# Patient Record
Sex: Female | Born: 1960 | Race: White | Hispanic: No | Marital: Married | Smoking: Former smoker
Health system: Southern US, Community
[De-identification: ages and names within clinical notes are randomized; demographics above are authoritative.]

## PROBLEM LIST (undated history)

## (undated) DIAGNOSIS — E039 Hypothyroidism, unspecified: Secondary | ICD-10-CM

## (undated) DIAGNOSIS — G629 Polyneuropathy, unspecified: Secondary | ICD-10-CM

## (undated) DIAGNOSIS — E782 Mixed hyperlipidemia: Secondary | ICD-10-CM

## (undated) HISTORY — PX: BREAST SURGERY: SHX581

## (undated) HISTORY — DX: Mixed hyperlipidemia: E78.2

## (undated) HISTORY — PX: BREAST REDUCTION SURGERY: SHX8

## (undated) HISTORY — DX: Polyneuropathy, unspecified: G62.9

---

## 1997-08-27 ENCOUNTER — Ambulatory Visit (HOSPITAL_COMMUNITY): Admission: RE | Admit: 1997-08-27 | Discharge: 1997-08-27 | Payer: Self-pay | Admitting: Vascular Surgery

## 1997-08-27 ENCOUNTER — Encounter: Payer: Self-pay | Admitting: Obstetrics and Gynecology

## 1997-09-09 ENCOUNTER — Other Ambulatory Visit: Admission: RE | Admit: 1997-09-09 | Discharge: 1997-09-09 | Payer: Self-pay | Admitting: Obstetrics and Gynecology

## 1997-10-09 ENCOUNTER — Ambulatory Visit (HOSPITAL_COMMUNITY): Admission: RE | Admit: 1997-10-09 | Discharge: 1997-10-09 | Payer: Self-pay | Admitting: Obstetrics and Gynecology

## 1997-10-09 ENCOUNTER — Encounter: Payer: Self-pay | Admitting: Obstetrics and Gynecology

## 1999-11-04 ENCOUNTER — Other Ambulatory Visit: Admission: RE | Admit: 1999-11-04 | Discharge: 1999-11-04 | Payer: Self-pay | Admitting: Obstetrics and Gynecology

## 2000-11-07 ENCOUNTER — Other Ambulatory Visit: Admission: RE | Admit: 2000-11-07 | Discharge: 2000-11-07 | Payer: Self-pay | Admitting: Obstetrics and Gynecology

## 2001-12-17 ENCOUNTER — Other Ambulatory Visit: Admission: RE | Admit: 2001-12-17 | Discharge: 2001-12-17 | Payer: Self-pay | Admitting: Obstetrics and Gynecology

## 2002-12-04 ENCOUNTER — Ambulatory Visit (HOSPITAL_COMMUNITY): Admission: RE | Admit: 2002-12-04 | Discharge: 2002-12-04 | Payer: Self-pay | Admitting: Obstetrics and Gynecology

## 2010-01-23 ENCOUNTER — Encounter: Payer: Self-pay | Admitting: Obstetrics and Gynecology

## 2015-12-15 ENCOUNTER — Encounter (INDEPENDENT_AMBULATORY_CARE_PROVIDER_SITE_OTHER): Payer: Self-pay | Admitting: *Deleted

## 2016-06-09 ENCOUNTER — Other Ambulatory Visit (INDEPENDENT_AMBULATORY_CARE_PROVIDER_SITE_OTHER): Payer: Self-pay | Admitting: *Deleted

## 2016-06-09 DIAGNOSIS — Z800 Family history of malignant neoplasm of digestive organs: Secondary | ICD-10-CM | POA: Insufficient documentation

## 2016-06-09 DIAGNOSIS — Z1211 Encounter for screening for malignant neoplasm of colon: Secondary | ICD-10-CM

## 2016-09-08 ENCOUNTER — Encounter (INDEPENDENT_AMBULATORY_CARE_PROVIDER_SITE_OTHER): Payer: Self-pay | Admitting: *Deleted

## 2016-09-08 ENCOUNTER — Telehealth (INDEPENDENT_AMBULATORY_CARE_PROVIDER_SITE_OTHER): Payer: Self-pay | Admitting: *Deleted

## 2016-09-08 NOTE — Telephone Encounter (Signed)
Patient needs trilyte 

## 2016-09-09 MED ORDER — PEG 3350-KCL-NA BICARB-NACL 420 G PO SOLR: 4000.0000 mL | Freq: Once | ORAL | 0 refills | Status: AC

## 2016-09-20 ENCOUNTER — Telehealth (INDEPENDENT_AMBULATORY_CARE_PROVIDER_SITE_OTHER): Payer: Self-pay | Admitting: *Deleted

## 2016-09-20 NOTE — Telephone Encounter (Signed)
Referring MD/PCP: howard   Procedure: tcs  Reason/Indication:  Screening,  fam hx colon ca  Has patient had this procedure before?  no  If so, when, by whom and where?    Is there a family history of colon cancer?  Yes, maternal grandmother  Who?  What age when diagnosed?    Is patient diabetic?   no      Does patient have prosthetic heart valve or mechanical valve?  no  Do you have a pacemaker?  no  Has patient ever had endocarditis? no  Has patient had joint replacement within last 12 months?  no  Does patient tend to be constipated or take laxatives? no  Does patient have a history of alcohol/drug use?  no  Is patient on Coumadin, Plavix and/or Aspirin? no  Medications: vitamins  Allergies: nkda  Medication Adjustment per Dr Karilyn Cota:   Procedure date & time: 10/12/16 at 1030

## 2016-09-21 NOTE — Telephone Encounter (Signed)
agree

## 2016-10-12 ENCOUNTER — Encounter (HOSPITAL_COMMUNITY)
Admission: RE | Disposition: A | Discharge status: Home / Self Care | Payer: Self-pay | Source: Ambulatory Visit | Attending: Internal Medicine

## 2016-10-12 ENCOUNTER — Ambulatory Visit (HOSPITAL_COMMUNITY)
Admission: RE | Admit: 2016-10-12 | Discharge: 2016-10-12 | Disposition: A | Discharge status: Home / Self Care | Payer: BLUE CROSS/BLUE SHIELD | Source: Ambulatory Visit | Attending: Internal Medicine | Admitting: Internal Medicine

## 2016-10-12 ENCOUNTER — Encounter (HOSPITAL_COMMUNITY): Payer: Self-pay | Admitting: *Deleted

## 2016-10-12 DIAGNOSIS — Z87891 Personal history of nicotine dependence: Secondary | ICD-10-CM | POA: Diagnosis not present

## 2016-10-12 DIAGNOSIS — Z1211 Encounter for screening for malignant neoplasm of colon: Secondary | ICD-10-CM | POA: Diagnosis not present

## 2016-10-12 DIAGNOSIS — K648 Other hemorrhoids: Secondary | ICD-10-CM | POA: Diagnosis not present

## 2016-10-12 DIAGNOSIS — E039 Hypothyroidism, unspecified: Secondary | ICD-10-CM | POA: Insufficient documentation

## 2016-10-12 DIAGNOSIS — K5730 Diverticulosis of large intestine without perforation or abscess without bleeding: Secondary | ICD-10-CM | POA: Insufficient documentation

## 2016-10-12 DIAGNOSIS — Z800 Family history of malignant neoplasm of digestive organs: Secondary | ICD-10-CM

## 2016-10-12 DIAGNOSIS — Z79899 Other long term (current) drug therapy: Secondary | ICD-10-CM | POA: Insufficient documentation

## 2016-10-12 HISTORY — DX: Hypothyroidism, unspecified: E03.9

## 2016-10-12 HISTORY — PX: COLONOSCOPY: SHX5424

## 2016-10-12 SURGERY — COLONOSCOPY
Anesthesia: Moderate Sedation

## 2016-10-12 MED ORDER — MEPERIDINE HCL 50 MG/ML IJ SOLN
INTRAMUSCULAR | Status: AC
  Filled 2016-10-12: qty 1

## 2016-10-12 MED ORDER — MIDAZOLAM HCL 5 MG/5ML IJ SOLN
INTRAMUSCULAR | Status: DC | PRN
  Administered 2016-10-12: 2 mg via INTRAVENOUS
  Administered 2016-10-12 (×3): 1 mg via INTRAVENOUS
  Administered 2016-10-12 (×2): 2 mg via INTRAVENOUS

## 2016-10-12 MED ORDER — SODIUM CHLORIDE 0.9 % IV SOLN
INTRAVENOUS | Status: DC
  Administered 2016-10-12: 1000 mL via INTRAVENOUS

## 2016-10-12 MED ORDER — BENEFIBER DRINK MIX PO PACK: 4.0000 g | PACK | Freq: Every day | ORAL | Status: AC

## 2016-10-12 MED ORDER — MIDAZOLAM HCL 5 MG/5ML IJ SOLN
INTRAMUSCULAR | Status: AC
  Filled 2016-10-12: qty 10

## 2016-10-12 MED ORDER — MEPERIDINE HCL 50 MG/ML IJ SOLN
INTRAMUSCULAR | Status: DC | PRN
  Administered 2016-10-12 (×2): 25 mg

## 2016-10-12 MED ORDER — DICYCLOMINE HCL 10 MG PO CAPS: 10.0000 mg | ORAL_CAPSULE | Freq: Two times a day (BID) | ORAL | 3 refills | Status: AC | PRN

## 2016-10-12 NOTE — Discharge Instructions (Signed)
Resume usual medications as before. High fiber diet. Benefiber or equivalent to 4 g by mouth daily at bedtime. Dicyclomine 10 mg by mouth twice daily as needed. No driving for 24 hours. Next screening colonoscopy in 10 years.

## 2016-10-12 NOTE — H&P (Signed)
Linda Aguilar is an 56 y.o. female.   Chief Complaint: Patient is here for colonoscopy. HPI: Patient is 56 year old Caucasian fe abdominal pain cha She has hemorrhoids and may notice blood every now and then with a bowel movement. Family history significant for CRC paternal grandfather was in his  Past Medical History:  Diagnosis Date  . Hypothyroidism     Past Surgical History:  Procedure Laterality Date  . BREAST SURGERY     reduction    Family History  Problem Relation Age of Onset  . Alzheimer's disease Mother   . Bladder Cancer Father   . Parkinson's disease Brother    Social History:  reports that she has quit smoking. She has never used smokeless tobacco. She reports that she drinks alcohol. She reports that she does not use drugs.  Allergies: No Known Allergies  Medications Prior to Admission  Medication Sig Dispense Refill  . ARMOUR THYROID 30 MG tablet Take 15-30 mg by mouth See admin instructions. Take 30 mg by mouth in the morning and take 15 mg by mouth at night  2  . Digestive Enzymes (DIGESTIVE ENZYME PO) Take 1 tablet by mouth 3 (three) times daily.    Marland Kitchen estradiol (ESTRACE) 0.5 MG tablet Take 0.5 mg by mouth daily.    Marland Kitchen MAGNESIUM PO Take 400 mg by mouth daily.     . Melatonin 3 MG CAPS Take 3 mg by mouth at bedtime.    . nicotine polacrilex (NICORETTE) 2 MG gum Take 2 mg by mouth as needed for smoking cessation.    . NON FORMULARY Inject 0.2 mLs into the muscle every Tuesday. Testosterone /ml injection    . Omega-3 Fatty Acids (OMEGA 3 PO) Take 2 capsules by mouth 2 (two) times daily.    . Probiotic Product (PROBIOTIC PO) Take 1 capsule by mouth 3 (three) times daily.    . progesterone (PROMETRIUM) 100 MG capsule Take 100 mg by mouth daily.  2  . vitamin B-12 (CYANOCOBALAMIN) 1000 MCG tablet Take 1,000 mcg by mouth daily.    Marland Kitchen VITAMIN D-VITAMIN K PO Take 1 tablet by mouth daily.    Marland Kitchen zinc gluconate 50 MG tablet Take 50 mg by mouth daily.      No results  found for this or any previous visit (from the past 48 hour(s)). No results found.  ROS  Blood pressure 139/83, pulse 63, temperature 97.6 F (36.4 C), temperature source Oral, resp. rate 11, height  (1.575 m), weight 130 lb (59 kg), last menstrual period 10/03/2016, SpO2 100 %. Physical Exam  Constitutional: She appears well-developed and well-nourished.  HENT:  Mouth/Throat: Oropharynx is clear and moist.  Eyes: Conjunctivae are normal. No scleral icterus.  Neck: No thyromegaly present.  Cardiovascular: Normal rate, regular rhythm and normal heart sounds.   No murmur heard. Respiratory: Effort normal and breath sounds normal.  GI: Soft. She exhibits no distension and no mass. There is no tenderness.  Musculoskeletal: She exhibits no edema.  Lymphadenopathy:    She has no cervical adenopathy.  Neurological: She is alert.  Skin: Skin is warm and dry.     Assessment/Plan Average risk screening colonoscopy.  Lionel December, MD 10/12/2016, 10:10 AM

## 2016-10-12 NOTE — Op Note (Signed)
[facility] Patient Name: Linda Aguilar Procedure Date: 10/12/2016 9:52 AM MRN: [id number] Date of Birth: 1960/02/07 Attending MD: Lionel December , MD CSN: [id number] Age: 56 Admit Type: Outpatient Procedure:                Colonoscopy Indications:              Screening for colorectal malignant neoplasm Providers:                Lionel December, MD, Jannett Celestine, RN, Edrick Kins, RN Referring MD:             Selinda Flavin, MD Medicines:                Meperidine 50 mg IV, Midazolam 9 mg IV Complications:            No immediate complications. Estimated Blood Loss:     Estimated blood loss: none. Procedure:                Pre-Anesthesia Assessment:                           - Prior to the procedure, a History and Physical                            was performed, and patient medications and                            allergies were reviewed. The patient's tolerance of                            previous anesthesia was also reviewed. The risks                            and benefits of the procedure and the sedation                            options and risks were discussed with the patient.                            All questions were answered, and informed consent                            was obtained. Prior Anticoagulants: The patient has                            taken no previous anticoagulant or antiplatelet                            agents. ASA Grade Assessment: II - A patient with                            mild systemic disease. After reviewing the risks                            and benefits, the patient was deemed in  satisfactory condition to undergo the procedure.                           After obtaining informed consent, the colonoscope                            was passed under direct vision. Throughout the                            procedure, the patient's blood pressure, pulse, and                            oxygen saturations  were monitored continuously. The                            EC-[id number] ([id number]) scope was introduced through                            the anus and advanced to the the cecum, identified                            by appendiceal orifice and ileocecal valve. The                            [id number] ([id number]) scope was introduced through                            the and advanced to the. The colonoscopy was                            technically difficult and complex due to restricted                            mobility of the colon. Successful completion of the                            procedure was aided by changing the patient to a                            supine position, using manual pressure and                            withdrawing the scope and replacing with the                            'babyscope'. The patient tolerated the procedure                            well. The quality of the bowel preparation was                            excellent. The ileocecal valve, appendiceal  orifice, and rectum were photographed. Scope In: 10:21:38 AM Scope Out: 10:52:19 AM Scope Withdrawal Time: 0 hours 7 minutes 1 second  Total Procedure Duration: 0 hours 30 minutes 41 seconds  Findings:      The perianal and digital rectal examinations were normal.      A few medium-mouthed diverticula were found in the descending colon,       transverse colon and ascending colon.      Scattered medium-mouthed diverticula were found in the sigmoid colon.      External and internal hemorrhoids were found during retroflexion. The       hemorrhoids were small. Impression:               - Diverticulosis in the descending colon, in the                            transverse colon and in the ascending colon.                           - Diverticulosis in the sigmoid colon.                           - External and internal hemorrhoids.                           - No specimens  collected. Moderate Sedation:      Moderate (conscious) sedation was administered by the endoscopy nurse       and supervised by the endoscopist. The following parameters were       monitored: oxygen saturation, heart rate, blood pressure, CO2       capnography and response to care. Total physician intraservice time was       41 minutes. Recommendation:           - Patient has a contact number available for                            emergencies. The signs and symptoms of potential                            delayed complications were discussed with the                            patient. Return to normal activities tomorrow.                            Written discharge instructions were provided to the                            patient.                           - High fiber diet today.                           - Continue present medications.                           - Benefiber  4 g by mouth daily at bedtime.                           - Dicyclomine  by mouth tw a day when necessary.                           - Repeat colonoscopy in 10 years for screening                            purposes. Procedure Code(s):        --- Professional ---                           [phone], Colonoscopy, flexible; diagnostic, including                            collection of specimen(s) by brushing or washing,                            when performed (separate procedure)                           99152, Moderate sedation services provided by the                            same physician or other qualified health care                            professional performing the diagnostic or                            therapeutic service that the sedation supports,                            requiring the presence of an independent trained                            observer to assist in the monitoring of the                            patient's level of consciousness and physiological                             status; initial 15 minutes of intraservice time,                            patient age 57 years or older                           [phone], Moderate sedation services; each additional                            15 minutes intraservice time  16109, Moderate sedation services; each additional                            15 minutes intraservice time Diagnosis Code(s):        --- Professional ---                           Z12.11, Encounter for screening for malignant                            neoplasm of colon                           K64.8, Other hemorrhoids                           K57.30, Diverticulosis of large intestine without                            perforation or abscess without bleeding CPT copyright 2016 American Medical Association. All rights reserved. The codes documented in this report are preliminary and upon coder review may  be revised to meet current compliance requirements. Lionel December, MD Lionel December, MD 10/12/2016 11:06:00 AM This report has been signed electronically. Number of Addenda: 0

## 2016-10-17 ENCOUNTER — Encounter (HOSPITAL_COMMUNITY): Payer: Self-pay | Admitting: Internal Medicine

## 2017-04-05 ENCOUNTER — Other Ambulatory Visit: Payer: Self-pay | Admitting: Unknown Physician Specialty

## 2017-04-05 DIAGNOSIS — Z139 Encounter for screening, unspecified: Secondary | ICD-10-CM

## 2017-06-08 ENCOUNTER — Ambulatory Visit
Admission: RE | Admit: 2017-06-08 | Discharge: 2017-06-08 | Disposition: A | Discharge status: Home / Self Care | Payer: BLUE CROSS/BLUE SHIELD | Source: Ambulatory Visit | Attending: Unknown Physician Specialty | Admitting: Unknown Physician Specialty

## 2017-06-08 ENCOUNTER — Other Ambulatory Visit: Payer: Self-pay | Admitting: Obstetrics and Gynecology

## 2017-06-08 DIAGNOSIS — Z139 Encounter for screening, unspecified: Secondary | ICD-10-CM

## 2018-11-11 ENCOUNTER — Encounter: Payer: Self-pay | Admitting: Cardiology

## 2018-11-11 NOTE — Progress Notes (Signed)
Cardiology Office Note  Date: 11/12/2018   ID: Linda Aguilar, DOB 10-24-1960, MRN [id number]  PCP:  Selinda Flavin, MD  Consulting Cardiologist: Jonelle Sidle, MD Electrophysiologist:  None   Chief Complaint  Patient presents with  . Palpitations    History of Present Illness: Linda Aguilar is a 58 y.o. female referred for cardiology consultation by Dr. Reuel Boom for the evaluation of palpitations.  She reports a feeling of faster heart rate than normal at times, wears an apple watch that records fluctuations in heart rate anywhere from the 70s to the low 100s without obvious precipitant by her report.  She has noted the symptoms almost daily for the last few weeks, less frequently prior to that.  Never any associated chest pain or syncope.  She has felt somewhat more short of breath recently.  No orthopnea or PND, no leg swelling.  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  She exercises regularly, previously did CrossFit prior to the pandemic.  States that her resting heart rate is usually in the 50s.  She does have hypothyroidism, has had adjustments in Synthroid which continue, recent TSH was 9.27.  She states that she follows a healthy diet, despite this cholesterol has increased as detailed below.  She is currently not on any other medical therapy for elevated lipids other than omega-3 supplements.  I personally reviewed her ECG today which shows normal sinus rhythm. Nonspecific T wave changes.  I reviewed her recent lab work as outlined below.  Past Medical History:  Diagnosis Date  . Hypothyroidism   . Mixed hyperlipidemia   . Peripheral neuropathy     Past Surgical History:  Procedure Laterality Date  . BREAST REDUCTION SURGERY    . COLONOSCOPY N/A 10/12/2016   Procedure: COLONOSCOPY;  Surgeon: Malissa Hippo, MD;  Location: [facility];  Service: Endoscopy;  Laterality: N/A;  1030    Current Outpatient Medications  Medication Sig Dispense  Refill  . dicyclomine (BENTYL) 10 MG capsule Take 1 capsule (10 mg total) by mouth 2 (two) times daily as needed. 60 capsule 3  . Digestive Enzymes (DIGESTIVE ENZYME PO) Take 1 tablet by mouth 3 (three) times daily.    Marland Kitchen liothyronine (CYTOMEL) 5 MCG tablet Take 5 mcg by mouth every morning.    Marland Kitchen MAGNESIUM PO Take 400 mg by mouth daily.     . Melatonin 3 MG CAPS Take 3 mg by mouth at bedtime.    Marland Kitchen MISCELLANEOUS VAGINAL PRODUCTS VA Place vaginally daily. Testosterone, progesterone, estradiol - compounded cream applied to abdomen or inner thigh    . nicotine polacrilex (NICORETTE) 2 MG gum Take 2 mg by mouth as needed for smoking cessation.    . Omega-3 Fatty Acids (OMEGA 3 PO) Take 2 capsules by mouth 2 (two) times daily.    . Probiotic Product (PROBIOTIC PO) Take 1 capsule by mouth 3 (three) times daily.    . vitamin B-12 (CYANOCOBALAMIN) 1000 MCG tablet Take 1,000 mcg by mouth daily.    Marland Kitchen VITAMIN D-VITAMIN K PO Take 1 tablet by mouth daily.    . Wheat Dextrin (BENEFIBER DRINK MIX) PACK Take 4 g by mouth at bedtime.    Marland Kitchen zinc gluconate 50 MG tablet Take 50 mg by mouth daily.     No current facility-administered medications for this visit.    Allergies:  Patient has no known allergies.   Social History: The patient  reports that she quit smoking about 20 years  ago. Her smoking use included cigarettes. She has never used smokeless tobacco. She reports current alcohol use. She reports that she does not use drugs.   Family History: The patient's family history includes Alzheimer's disease in her mother; Bladder Cancer in her father; Parkinson's disease in her brother.   ROS:  Please see the history of present illness. Otherwise, complete review of systems is positive for none.  All other systems are reviewed and negative.   Physical Exam: VS:  BP (!) 160/84   Pulse 70   Ht 5\' 2"  (1.575 m)   Wt 130 lb 12.8 oz (59.3 kg)   SpO2 100%   BMI 23.92 kg/m , BMI Body mass index is 23.92 kg/m.  Wt  Readings from Last 3 Encounters:  11/12/18 130 lb 12.8 oz (59.3 kg)  10/12/16 130 lb (59 kg)    General: Patient appears comfortable at rest. HEENT: Conjunctiva and lids normal, wearing a mask. Neck: Supple, no elevated JVP or carotid bruits, no thyromegaly. Lungs: Clear to auscultation, nonlabored breathing at rest. Cardiac: Regular rate and rhythm, no S3 or significant systolic murmur, no pericardial rub. Abdomen: Soft, nontender, bowel sounds present. Extremities: No pitting edema, distal pulses 2+. Skin: Warm and dry. Musculoskeletal: No kyphosis. Neuropsychiatric: Alert and oriented x3, affect grossly appropriate.  ECG:  There are no old tracings available for review.  Recent Labwork:  November 2020: Hemoglobin 14.1, platelets 249, cholesterol 249, triglycerides 46, HDL 111, LDL 131, BUN 15, creatinine 0.77, potassium 3.6, AST 35, ALT 24, TSH 9.27  Other Studies Reviewed Today:  No prior cardiac testing available for review.  Assessment and Plan:  1.  Palpitations as described above.  No associated syncope.  Mild shortness of breath at times.  Baseline ECG shows sinus rhythm with normal intervals.  Plan is to obtain a 7-day Zio patch for further evaluation of heart rhythm, also an echocardiogram to assess cardiac structure and function.  2.  Hypothyroidism, currently undergoing changes in Synthroid replacement.  Recent TSH 9.27.  Would not necessarily expect worsening palpitations related to hypothyroidism.  3.  Mixed hyperlipidemia, she is on omega-3 supplements.  Recent lab work reviewed.  LDL has trended upward over time.  Medication Adjustments/Labs and Tests Ordered: Current medicines are reviewed at length with the patient today.  Concerns regarding medicines are outlined above.   Tests Ordered: Orders Placed This Encounter  Procedures  . LONG TERM MONITOR (3-14 DAYS)  . EKG 12-Lead  . ECHOCARDIOGRAM COMPLETE    Medication Changes: No orders of the defined  types were placed in this encounter.   Disposition:  Follow up test results and determine next step.  Signed, Satira Sark, MD, [facility] 11/12/2018 2:09 PM    [facility] [address], [place], [place] [address] Phone: [phone]; Fax: [phone]

## 2018-11-12 ENCOUNTER — Telehealth: Payer: Self-pay | Admitting: Cardiology

## 2018-11-12 ENCOUNTER — Other Ambulatory Visit: Payer: Self-pay

## 2018-11-12 ENCOUNTER — Encounter: Payer: Self-pay | Admitting: Cardiology

## 2018-11-12 ENCOUNTER — Ambulatory Visit: Payer: BLUE CROSS/BLUE SHIELD | Admitting: Cardiology

## 2018-11-12 VITALS — BP 160/84 | HR 70 | Ht 62.0 in | Wt 130.8 lb

## 2018-11-12 DIAGNOSIS — R002 Palpitations: Secondary | ICD-10-CM | POA: Diagnosis not present

## 2018-11-12 DIAGNOSIS — E039 Hypothyroidism, unspecified: Secondary | ICD-10-CM

## 2018-11-12 DIAGNOSIS — E782 Mixed hyperlipidemia: Secondary | ICD-10-CM | POA: Diagnosis not present

## 2018-11-12 DIAGNOSIS — R0602 Shortness of breath: Secondary | ICD-10-CM | POA: Diagnosis not present

## 2018-11-12 NOTE — Telephone Encounter (Signed)
Pre-cert Verification for the following procedure   ° °LONG TERM MONITOR (3-14 DAYS) ° °

## 2018-11-12 NOTE — Patient Instructions (Addendum)
Medication Instructions:    Your physician recommends that you continue on your current medications as directed. Please refer to the Current Medication list given to you today.  Labwork:  NONE  Testing/Procedures: Your physician has requested that you have an echocardiogram. Echocardiography is a painless test that uses sound waves to create images of your heart. It provides your doctor with information about the size and shape of your heart and how well your heart's chambers and valves are working. This procedure takes approximately one hour. There are no restrictions for this procedure. Your physician has recommended that you wear an event monitor for 7 days. Event monitors are medical devices that record the heart's electrical activity. Doctors most often [place] these monitors to diagnose arrhythmias. Arrhythmias are problems with the speed or rhythm of the heartbeat. The monitor is a small, portable device. You can wear one while you do your normal daily activities. This is usually used to diagnose what is causing palpitations/syncope (passing out).  Follow-Up:  Your physician recommends that you schedule a follow-up appointment in: pending.  Any Other Special Instructions Will Be Listed Below (If Applicable).  If you need a refill on your cardiac medications before your next appointment, please call your pharmacy.

## 2018-11-12 NOTE — Telephone Encounter (Signed)
Percert for Echo scheduled for 11-19-2018 at [facility]

## 2018-11-19 ENCOUNTER — Ambulatory Visit (HOSPITAL_COMMUNITY)
Admission: RE | Admit: 2018-11-19 | Discharge: 2018-11-19 | Disposition: A | Discharge status: Home / Self Care | Payer: BC Managed Care – PPO | Source: Ambulatory Visit | Attending: Cardiology | Admitting: Cardiology

## 2018-11-19 DIAGNOSIS — R0602 Shortness of breath: Secondary | ICD-10-CM | POA: Insufficient documentation

## 2018-11-19 NOTE — Progress Notes (Signed)
*  PRELIMINARY RESULTS* Echocardiogram 2D Echocardiogram has been performed.  Samuel [place] 11/19/2018, 11:03 AM

## 2018-11-20 ENCOUNTER — Other Ambulatory Visit (INDEPENDENT_AMBULATORY_CARE_PROVIDER_SITE_OTHER): Payer: BC Managed Care – PPO

## 2018-11-20 ENCOUNTER — Telehealth: Payer: Self-pay | Admitting: *Deleted

## 2018-11-20 DIAGNOSIS — R002 Palpitations: Secondary | ICD-10-CM | POA: Diagnosis not present

## 2018-11-20 NOTE — Telephone Encounter (Signed)
-----   Message from Satira Sark, MD sent at 11/19/2018 12:31 PM EST ----- Results reviewed.  Cardiac function is normal with LVEF 60 to 65%.  No significant valvular abnormalities to associate with symptoms either.  Await monitor results.

## 2018-11-20 NOTE — Telephone Encounter (Signed)
Patient informed. Copy sent to PCP °

## 2018-12-05 ENCOUNTER — Telehealth: Payer: Self-pay | Admitting: *Deleted

## 2018-12-05 NOTE — Telephone Encounter (Signed)
-----   Message from Satira Sark, MD sent at 12/05/2018 10:34 AM EST ----- Results reviewed.  Overall rhythm is normal.  She does have brief episodes of SVT and atrial tachycardia that would correspond potentially to her elevated heart rate recordings at times she has noted and also palpitations.  These are not dangerous, but if they are bothersome to her we could consider starting a low-dose beta-blocker and see how she does.  Toprol-XL 12.5 mg daily could be initiated.  If she decides to start medicine would schedule a 41-month follow-up.

## 2018-12-05 NOTE — Telephone Encounter (Signed)
Patient informed. Request copy of report be mailed to home address. Patient will contact our office if she decides on starting toprol xl 12.5 mg daily. Copy sent to PCP

## 2018-12-12 ENCOUNTER — Ambulatory Visit: Payer: BLUE CROSS/BLUE SHIELD | Admitting: Cardiology

## 2019-01-07 ENCOUNTER — Other Ambulatory Visit: Payer: Self-pay

## 2019-01-07 ENCOUNTER — Ambulatory Visit: Payer: BC Managed Care – PPO | Attending: Internal Medicine

## 2019-01-07 DIAGNOSIS — Z20822 Contact with and (suspected) exposure to covid-19: Secondary | ICD-10-CM

## 2019-01-08 LAB — NOVEL CORONAVIRUS, NAA: SARS-CoV-2, NAA: NOT DETECTED

## 2019-11-08 ENCOUNTER — Emergency Department (HOSPITAL_COMMUNITY)
Admission: EM | Admit: 2019-11-08 | Discharge: 2019-11-08 | Disposition: A | Discharge status: Home / Self Care | Payer: BC Managed Care – PPO | Attending: Emergency Medicine | Admitting: Emergency Medicine

## 2019-11-08 ENCOUNTER — Other Ambulatory Visit: Payer: Self-pay

## 2019-11-08 ENCOUNTER — Encounter (HOSPITAL_COMMUNITY): Payer: Self-pay

## 2019-11-08 DIAGNOSIS — E039 Hypothyroidism, unspecified: Secondary | ICD-10-CM | POA: Diagnosis not present

## 2019-11-08 DIAGNOSIS — L03011 Cellulitis of right finger: Secondary | ICD-10-CM

## 2019-11-08 DIAGNOSIS — Z87891 Personal history of nicotine dependence: Secondary | ICD-10-CM | POA: Insufficient documentation

## 2019-11-08 DIAGNOSIS — Z79899 Other long term (current) drug therapy: Secondary | ICD-10-CM | POA: Diagnosis not present

## 2019-11-08 DIAGNOSIS — M79644 Pain in right finger(s): Secondary | ICD-10-CM | POA: Diagnosis present

## 2019-11-08 MED ORDER — BUPIVACAINE HCL 0.25 % IJ SOLN: 10.0000 mL | Freq: Once | INTRAMUSCULAR | Status: DC

## 2019-11-08 MED ORDER — BUPIVACAINE HCL (PF) 0.25 % IJ SOLN
10.0000 mL | Freq: Once | INTRAMUSCULAR | Status: AC
  Administered 2019-11-08: 10 mL
  Filled 2019-11-08 (×2): qty 30

## 2019-11-08 NOTE — ED Provider Notes (Addendum)
[facility] EMERGENCY DEPARTMENT Provider Note   CSN: [id number] Arrival date & time: 11/08/19  0865     History Chief Complaint  Patient presents with  . Finger Injury    Linda Aguilar is a 59 y.o. female.   Hand Pain This is a new problem. The current episode started more than 1 week ago. The problem occurs constantly. The problem has not changed since onset.Pertinent negatives include no chest pain, no abdominal pain, no headaches and no shortness of breath. Exacerbated by: touch. Nothing relieves the symptoms. She has tried a warm compress for the symptoms.  59 year old female who presents for evaluation of right index finger pain and swelling.  She has had about 14 days of pain and swelling.  It is throbbing, worse with touch, nothing seems to make it a lot better.  She has seen her primary care office and they tried to lift the cuticle with the edge of iris scissors without any relief of her symptoms.  She has also been on Keflex and Levaquin without improvement.  She denies a history of diabetes.  She denies fevers or chills.     Past Medical History:  Diagnosis Date  . Hypothyroidism   . Mixed hyperlipidemia   . Peripheral neuropathy     Patient Active Problem List   Diagnosis Date Noted  . Special screening for malignant neoplasms, colon 06/09/2016  . Family history of colon cancer 06/09/2016    Past Surgical History:  Procedure Laterality Date  . BREAST REDUCTION SURGERY    . COLONOSCOPY N/A 10/12/2016   Procedure: COLONOSCOPY;  Surgeon: Malissa Hippo, MD;  Location: [facility];  Service: Endoscopy;  Laterality: N/A;  1030     OB History   No obstetric history on file.     Family History  Problem Relation Age of Onset  . Alzheimer's disease Mother   . Bladder Cancer Father   . Parkinson's disease Brother     Social History   Tobacco Use  . Smoking status: Former Smoker    Types: Cigarettes    Quit date: 01/03/1998    Years since quitting:  21.8  . Smokeless tobacco: Never Used  Vaping Use  . Vaping Use: Never used  Substance Use Topics  . Alcohol use: Yes    Comment: 3-4 glasses of wine per week  . Drug use: No    Home Medications Prior to Admission medications   Medication Sig Start Date End Date Taking? Authorizing Provider  dicyclomine (BENTYL) 10 MG capsule Take 1 capsule (10 mg total) by mouth 2 (two) times daily as needed. 10/12/16   Malissa Hippo, MD  Digestive Enzymes (DIGESTIVE ENZYME PO) Take 1 tablet by mouth 3 (three) times daily.    [provider]  liothyronine (CYTOMEL) 5 MCG tablet Take 5 mcg by mouth every morning. 09/24/18   [provider]  MAGNESIUM PO Take 400 mg by mouth daily.     [provider]  Melatonin 3 MG CAPS Take 3 mg by mouth at bedtime.    [provider]  MISCELLANEOUS VAGINAL PRODUCTS [facility] vaginally daily. Testosterone, progesterone, estradiol - compounded cream applied to abdomen or inner thigh    [provider]  nicotine polacrilex (NICORETTE) 2 MG gum Take 2 mg by mouth as needed for smoking cessation.    [provider]  Omega-3 Fatty Acids (OMEGA 3 PO) Take 2 capsules by mouth 2 (two) times daily.    [provider]  Probiotic Product (PROBIOTIC PO) Take 1 capsule by mouth 3 (three) times daily.    [provider]  vitamin B-12 (CYANOCOBALAMIN) 1000 MCG tablet Take 1,000 mcg by mouth daily.    [provider]  VITAMIN D-VITAMIN K PO Take 1 tablet by mouth daily.    [provider]  Wheat Dextrin (BENEFIBER DRINK MIX) PACK Take 4 g by mouth at bedtime. 10/12/16   Malissa Hippo, MD  zinc gluconate 50 MG tablet Take 50 mg by mouth daily.    [provider]    Allergies    Patient has no known allergies.  Review of Systems   Review of Systems  Respiratory: Negative for shortness of breath.   Cardiovascular: Negative for chest pain.  Gastrointestinal: Negative for abdominal  pain.  Neurological: Negative for headaches.    Physical Exam Updated Vital Signs BP (!) 141/88 (BP Location: Right Arm)   Pulse 86   Temp 98.1 F (36.7 C) (Oral)   Resp 18   Ht 5\' 2"  (1.575 m)   Wt 56.7 kg   SpO2 100%   BMI 22.86 kg/m   Physical Exam Vitals and nursing note reviewed.  Constitutional:      General: She is not in acute distress.    Appearance: She is well-developed. She is not diaphoretic.  HENT:     Head: Normocephalic and atraumatic.  Eyes:     General: No scleral icterus.    Conjunctiva/sclera: Conjunctivae normal.  Cardiovascular:     Rate and Rhythm: Normal rate and regular rhythm.     Heart sounds: Normal heart sounds. No murmur heard.  No friction rub. No gallop.   Pulmonary:     Effort: Pulmonary effort is normal. No respiratory distress.     Breath sounds: Normal breath sounds.  Abdominal:     General: Bowel sounds are normal. There is no distension.     Palpations: Abdomen is soft. There is no mass.     Tenderness: There is no abdominal tenderness. There is no guarding.  Musculoskeletal:     Cervical back: Normal range of motion.     Comments: R index finger with tight swelling of the distal tip. There is fluctuance along the radial border  Skin:    General: Skin is warm and dry.  Neurological:     Mental Status: She is alert and oriented to person, place, and time.  Psychiatric:        Behavior: Behavior normal.     ED Results / Procedures / Treatments   Labs (all labs ordered are listed, but only abnormal results are displayed) Labs Reviewed - No data to display  EKG None  Radiology No results found.  Procedures Drain paronychia  Date/Time: 11/08/2019 4:40 PM Performed by: 13/05/2019, PA-C Authorized by: Arthor Captain, PA-C  Preparation: Patient was prepped and draped in the usual sterile fashion. Local anesthesia used: no (Digital block performed by palpation of landmarks MCP block R index  finger)  Anesthesia: Local anesthesia used: no (Digital block performed by palpation of landmarks MCP block R index finger)  Sedation: Patient sedated: no  Patient tolerance: patient tolerated the procedure well with no immediate complications Comments: I&D performed along the radial nail margin of the R index finger using an 11 blade. Single straight incision produced copious purulent discharge. Wound probed and deloculated using skin pickups. Flushed with sterile saline.    (including critical care time)  Medications Ordered in ED Medications - No data to display  ED Course  I have reviewed the triage vital signs and the nursing notes.  Pertinent labs & imaging results that were available during my care of the patient were reviewed by me and considered in my medical decision making (see chart for details).    MDM Rules/Calculators/A&P                          Mikel Pyon presents with abscess. There is no area of retained pus after procedure. The presentation of Linda Aguilar is NOT consistent with necrotizing fascitis or osteomyolitis. There is no evidence of retained foreign body, neurovascular or tendon injury. The presentation of Linda Aguilar is NOT consistent with sepsis and/or bacteremia. Zachery Dauer meets outpatient criteria for treatment . She does NOT need empiric abx.  Strict return and follow-up precautions have been given by me personally or by detailed written instructions verbalized by nursing staff using the teach back method to the patient/family/caregiver(s).  Data Reviewed/Counseling: I have reviewed the patient's vital signs, nursing notes, and other relevant tests/information. I had a detailed discussion regarding the historical points, exam findings, and any diagnostic results supporting the discharge diagnosis. I also discussed the need for outpatient follow-up and the need to return to the ED if symptoms worsen or if there are any questions or concerns  that arise at home.  Final Clinical Impression(s) / ED Diagnoses Final diagnoses:  Paronychia of finger of right hand    Rx / DC Orders ED Discharge Orders    None       Arthor Captain, PA-C 11/08/19 1644    Arthor Captain, PA-C 11/08/19 1645    Vanetta Mulders, MD 11/09/19 [phone]

## 2019-11-08 NOTE — ED Triage Notes (Signed)
Pt presents to ED with complaints of ongoing right index finger infection. Pt states she has seen her PCP and unable to clear infection up. Pt denies fevers.

## 2019-11-08 NOTE — Discharge Instructions (Signed)
Contact a health care provider if: °Your symptoms get worse or do not improve with treatment. °You have continued or increased fluid, blood, or pus coming from the affected area. °Your finger or knuckle becomes swollen or difficult to move. °Get help right away if you have: °A fever or chills. °Redness spreading away from the affected area. °Joint or muscle pain. °

## 2021-06-11 ENCOUNTER — Other Ambulatory Visit: Payer: Self-pay | Admitting: Nurse Practitioner

## 2021-06-11 DIAGNOSIS — N6325 Unspecified lump in the left breast, overlapping quadrants: Secondary | ICD-10-CM

## 2021-06-16 ENCOUNTER — Ambulatory Visit
Admission: RE | Admit: 2021-06-16 | Discharge: 2021-06-16 | Disposition: A | Discharge status: Home / Self Care | Payer: BC Managed Care – PPO | Source: Ambulatory Visit | Attending: Nurse Practitioner | Admitting: Nurse Practitioner

## 2021-06-16 DIAGNOSIS — N6325 Unspecified lump in the left breast, overlapping quadrants: Secondary | ICD-10-CM

## 2021-09-10 ENCOUNTER — Other Ambulatory Visit: Payer: Self-pay | Admitting: Nurse Practitioner

## 2021-09-10 DIAGNOSIS — N6320 Unspecified lump in the left breast, unspecified quadrant: Secondary | ICD-10-CM

## 2021-12-29 ENCOUNTER — Ambulatory Visit
Admission: RE | Admit: 2021-12-29 | Discharge: 2021-12-29 | Disposition: A | Discharge status: Home / Self Care | Payer: BC Managed Care – PPO | Source: Ambulatory Visit | Attending: Nurse Practitioner | Admitting: Nurse Practitioner

## 2021-12-29 ENCOUNTER — Other Ambulatory Visit: Payer: Self-pay | Admitting: Nurse Practitioner

## 2021-12-29 DIAGNOSIS — N6320 Unspecified lump in the left breast, unspecified quadrant: Secondary | ICD-10-CM

## 2022-06-30 ENCOUNTER — Other Ambulatory Visit: Payer: Self-pay | Admitting: Nurse Practitioner

## 2022-06-30 ENCOUNTER — Ambulatory Visit
Admission: RE | Admit: 2022-06-30 | Discharge: 2022-06-30 | Disposition: A | Discharge status: Home / Self Care | Payer: BC Managed Care – PPO | Source: Ambulatory Visit | Attending: Nurse Practitioner | Admitting: Nurse Practitioner

## 2022-06-30 DIAGNOSIS — N6320 Unspecified lump in the left breast, unspecified quadrant: Secondary | ICD-10-CM

## 2022-07-05 ENCOUNTER — Ambulatory Visit
Admission: RE | Admit: 2022-07-05 | Discharge: 2022-07-05 | Disposition: A | Discharge status: Home / Self Care | Payer: BC Managed Care – PPO | Source: Ambulatory Visit | Attending: Nurse Practitioner | Admitting: Nurse Practitioner

## 2022-07-05 DIAGNOSIS — N6320 Unspecified lump in the left breast, unspecified quadrant: Secondary | ICD-10-CM

## 2022-07-05 HISTORY — PX: BREAST BIOPSY: SHX20

## 2023-08-09 ENCOUNTER — Other Ambulatory Visit: Payer: Self-pay | Admitting: Obstetrics and Gynecology

## 2023-08-09 DIAGNOSIS — N6320 Unspecified lump in the left breast, unspecified quadrant: Secondary | ICD-10-CM

## 2023-08-17 ENCOUNTER — Ambulatory Visit
Admission: RE | Admit: 2023-08-17 | Discharge: 2023-08-17 | Disposition: A | Discharge status: Home / Self Care | Source: Ambulatory Visit | Attending: Obstetrics and Gynecology | Admitting: Obstetrics and Gynecology

## 2023-08-17 DIAGNOSIS — N6320 Unspecified lump in the left breast, unspecified quadrant: Secondary | ICD-10-CM

## 2023-10-29 ENCOUNTER — Encounter (HOSPITAL_COMMUNITY): Payer: Self-pay

## 2023-10-29 ENCOUNTER — Emergency Department (HOSPITAL_COMMUNITY)

## 2023-10-29 ENCOUNTER — Emergency Department (HOSPITAL_COMMUNITY)
Admission: EM | Admit: 2023-10-29 | Discharge: 2023-10-29 | Disposition: A | Discharge status: Home / Self Care | Attending: Emergency Medicine | Admitting: Emergency Medicine

## 2023-10-29 ENCOUNTER — Other Ambulatory Visit: Payer: Self-pay

## 2023-10-29 DIAGNOSIS — S61211A Laceration without foreign body of left index finger without damage to nail, initial encounter: Secondary | ICD-10-CM | POA: Diagnosis present

## 2023-10-29 DIAGNOSIS — S62661A Nondisplaced fracture of distal phalanx of left index finger, initial encounter for closed fracture: Secondary | ICD-10-CM | POA: Diagnosis not present

## 2023-10-29 DIAGNOSIS — W290XXA Contact with powered kitchen appliance, initial encounter: Secondary | ICD-10-CM | POA: Insufficient documentation

## 2023-10-29 MED ORDER — LIDOCAINE HCL (PF) 1 % IJ SOLN
10.0000 mL | Freq: Once | INTRAMUSCULAR | Status: AC
  Administered 2023-10-29: 10 mL
  Filled 2023-10-29: qty 10

## 2023-10-29 NOTE — ED Notes (Signed)
 PA at bedside with suture cart and lidocaine

## 2023-10-29 NOTE — ED Triage Notes (Signed)
 Pt to er, pt states that she accidentally cut her L pointer finger in an immersion blender.  Pt has dressing in place

## 2023-10-29 NOTE — ED Provider Notes (Signed)
 [facility] EMERGENCY DEPARTMENT AT [facility] Provider Note   CSN: [id number] Arrival date & time: 10/29/23  1033     Patient presents with: Laceration   Linda Aguilar is a 63 y.o. female with no significant past medical history presents with concern for a cut to the left index finger and that occurred just prior to arrival.  States she cut her finger on an immersion blender.  She denies any numbness to the finger.  Reports tetanus shot was updated last Sunday.    Laceration      Prior to Admission medications   Medication Sig Start Date End Date Taking? Authorizing Provider  dicyclomine  (BENTYL ) 10 MG capsule Take 1 capsule (10 mg total) by mouth 2 (two) times daily as needed. 10/12/16   Golda Claudis PENNER, MD  Digestive Enzymes (DIGESTIVE ENZYME PO) Take 1 tablet by mouth 3 (three) times daily.    [provider]  liothyronine (CYTOMEL) 5 MCG tablet Take 5 mcg by mouth every morning. 09/24/18   [provider]  MAGNESIUM PO Take 400 mg by mouth daily.     [provider]  Melatonin 3 MG CAPS Take 3 mg by mouth at bedtime.    [provider]  MISCELLANEOUS VAGINAL PRODUCTS [facility] vaginally daily. Testosterone, progesterone, estradiol - compounded cream applied to abdomen or inner thigh    [provider]  nicotine polacrilex (NICORETTE) 2 MG gum Take 2 mg by mouth as needed for smoking cessation.    [provider]  Omega-3 Fatty Acids (OMEGA 3 PO) Take 2 capsules by mouth 2 (two) times daily.    [provider]  Probiotic Product (PROBIOTIC PO) Take 1 capsule by mouth 3 (three) times daily.    [provider]  vitamin B-12 (CYANOCOBALAMIN) 1000 MCG tablet Take 1,000 mcg by mouth daily.    [provider]  VITAMIN D-VITAMIN K PO Take 1 tablet by mouth daily.    [provider]  Wheat Dextrin (BENEFIBER DRINK MIX) PACK Take 4 g by mouth at bedtime. 10/12/16   Golda Claudis PENNER, MD   zinc gluconate 50 MG tablet Take 50 mg by mouth daily.    [provider]    Allergies: Patient has no known allergies.    Review of Systems  Skin:  Positive for wound.    Updated Vital Signs BP (!) 161/102 (BP Location: Right Arm)   Pulse 60   Temp 97.6 F (36.4 C) (Oral)   Resp 18   Ht 5' 2 (1.575 m)   Wt 59 kg   SpO2 100%   BMI 23.78 kg/m   Physical Exam Vitals and nursing note reviewed.  Constitutional:      Appearance: Normal appearance.  HENT:     Head: Atraumatic.  Cardiovascular:     Comments: Cap refill of the left index finger is brisk Pulmonary:     Effort: Pulmonary effort is normal.  Musculoskeletal:     Comments: Left index finger:  General Multiple small lacerations to the distal aspect of the left index finger.  Mild bleeding upon arrival.  No visualized foreign debris.  No visualization of bone.  Lacerations do not involve the fingernail  Palpation Mild tenderness over the distal phalanx of the left index finger.  Nontender over the proximal or middle phalanx of the left index finger.  ROM Full flexion extension at the wrist Full flexion extension at the 1st through 5th MCPs, PIPs, DIPs of the left hand  Sensation: Sensation intact throughout the 1st-5th digits   Neurological:     General: No focal deficit present.     Mental Status: She is alert.  Psychiatric:        Mood and Affect: Mood normal.        Behavior: Behavior normal.     (all labs ordered are listed, but only abnormal results are displayed) Labs Reviewed - No data to display  EKG: None  Radiology: DG Finger Index Left Result Date: 10/29/2023 CLINICAL DATA:  Laceration.  Immersion blender injury. EXAM: LEFT INDEX FINGER 2+V COMPARISON:  None Available. FINDINGS: Suspected nondisplaced fracture of the distal phalanx, seen only on the [facility] view. No intra-articular involvement. No dislocation. A dressing overlies the distal digit with skin irregularity. No  radiopaque foreign body. IMPRESSION: Suspected nondisplaced fracture of the distal phalanx. No radiopaque foreign body. Electronically Signed   By: Andrea Gasman M.D.   On: 10/29/2023 12:48     .Laceration Repair  Date/Time: 10/29/2023 2:41 PM  Performed by: Veta Palma, PA-C Authorized by: Veta Palma, PA-C   Consent:    Consent obtained:  Verbal   Consent given by:  Patient   Risks, benefits, and alternatives were discussed: yes     Risks discussed:  Infection, pain, poor cosmetic result, nerve damage, poor wound healing, vascular damage, tendon damage and retained foreign body Universal protocol:    Imaging studies available: yes     Patient identity confirmed:  Verbally with patient Anesthesia:    Anesthesia method:  Nerve block   Block needle gauge:  25 G   Block anesthetic:  Lidocaine 1% w/o epi (5ml)   Block injection procedure:  Anatomic landmarks identified, introduced needle, incremental injection, negative aspiration for blood and anatomic landmarks palpated   Block outcome:  Anesthesia achieved Laceration details:    Location:  Finger   Finger location:  L index finger   Length (cm):  6 Pre-procedure details:    Preparation:  Imaging obtained to evaluate for foreign bodies and patient was prepped and draped in usual sterile fashion Exploration:    Imaging obtained: x-ray     Imaging outcome: foreign body not noted     Wound exploration: wound explored through full range of motion and entire depth of wound visualized     Wound extent: underlying fracture     Wound extent: fascia not violated, no foreign body, no signs of injury, no nerve damage, no tendon damage and no vascular damage     Contaminated: no   Treatment:    Area cleansed with:  Chlorhexidine   Irrigation solution:  Sterile saline   Irrigation volume:  1L   Irrigation method:  Pressure wash   Debridement:  None   Undermining:  None Skin repair:    Repair method:  Sutures   Suture  size:  5-0   Suture material:  Prolene   Suture technique:  Simple interrupted   Number of sutures:  10 Repair type:    Repair type:  Simple Post-procedure details:    Dressing:  Sterile dressing and splint for protection   Procedure completion:  Tolerated well, no immediate complications    Medications Ordered in the ED  lidocaine (PF) (XYLOCAINE) 1 % injection 10 mL (10 mLs Infiltration Given by Other 10/29/23 1333)  Medical Decision Making Amount and/or Complexity of Data Reviewed Radiology: ordered.  Risk Prescription drug management.    Differential diagnosis includes but is not limited to laceration, wound infection, tendon injury, nerve injury, vascular injury, retained foreign body, fracture, dislocation  ED Course:  Upon initial evaluation, patient is well-appearing, no acute distress.  She cut her left index finger with an immersion plunger just prior to arrival.  Mild bleeding to multiple laceration sites of the left index finger upon arrival.  She declines any pain medicine.  Reports her tetanus is up-to-date.  She is neurovascularly intact in the left index finger.  She has full range of motion of the 1st through 5th digits of the left hand.  No concern for tendon injury at this time.   Imaging Studies ordered: I ordered imaging studies including x-ray left index finger I independently visualized the imaging with scope of interpretation limited to determining acute life threatening conditions related to emergency care. Imaging showed  IMPRESSION:  Suspected nondisplaced fracture of the distal phalanx. No radiopaque  foreign body.   I agree with the radiologist interpretation  Medications Given: Lidocaine  Imaging was reviewed which revealed no retained foreign body, but did show a nondisplaced fracture of the distal phalanx of the left index finger.  Patient's wound was irrigated well with sterile saline and cleaned with  chlorhexidine swabs. Anesthesia achieved with index finger nerve block with lidocaine. Patient's lacerations was repaired with 10 simple interrupted 5-0 prolene sutures. Patient tolerated this well without any immediate complications. Suture repair occurred less than 24 hours after initial injury. Patient remains neurovascularly intact after suture placement. Remains with full range of motion of the left index finger after suture placement.  Patient's Tdap is up-to-date per patient.  Patient's finger wrapped with sterile gauze dressing and a finger splint was applied for the nondisplaced fracture of the distal phalanx.  Patient stable and appropriate for discharge home.     Impression: Left index finger lacerations Closed, non-displaced fracture of the distal phalanx of the left index finger  Disposition:  Patient discharged home with instructions to keep laceration site clean with gentle soap and water. Apply neosporin or bacitracin over the area as shown here and keep covered with sterile dressing. Follow up with PCP or return to ER in 10-14 days for suture/staple removal.  Keep the finger splint on until cleared by orthopedics.  Follow-up with orthopedics within the next week for further management of her fracture.  Tylenol and ibuprofen as needed for pain.  Return precautions given and patient verbalized understanding.    This chart was dictated using voice recognition software, Dragon. Despite the best efforts of this provider to proofread and correct errors, errors may still occur which can change documentation meaning.       Final diagnoses:  Laceration of left index finger without foreign body without damage to nail, initial encounter  Closed nondisplaced fracture of distal phalanx of left index finger, initial encounter    ED Discharge Orders     None          Veta Palma, PA-C 10/29/23 1443    Melvenia Motto, MD 10/29/23 1640

## 2023-10-29 NOTE — Discharge Instructions (Signed)
 You had 10 non-absorbable sutures placed today in the left index finger. You must get your sutures rechecked for possible removal in 10-14 days. We recommend visiting your PCP or an urgent care for suture removal. However, you may also return back to the ER if you are unable to be seen by your PCP or at urgent care.   You may gently clean the area around your laceration as needed with water. Place antibiotic ointment such as bacitracin or neosporin over your laceration after cleaning the area.  Keep the laceration covered with sterile gauze as shown here if you are doing an activity in which it may get dirty. You may pick these supplies up at any drugstore.  Do not submerge your laceration in water (no baths, swimming) until it is fully healed. You may shower.   You may take up to 1000mg  of tylenol every 6 hours as needed for pain. Do not take more then 4g per day.   You may use up to 600mg  ibuprofen every 6 hours as needed for pain.  Do not exceed 2.4g of ibuprofen per day.  Your x-ray today showed a small fracture at the distal end of your finger bone.  You have been placed into a finger splint to help pull this fracture in place.  Please keep the splint on at all times.  You may take it off to wash your hands or when showering.  Please contact the orthopedics office listed below for follow-up appointment for your finger fracture within the next week.  Return to the ER should you develop fever, chills, pus drainage from your wound, redness around your wound.

## 2023-12-24 IMAGING — MG MM DIGITAL DIAGNOSTIC UNILAT*L* W/ TOMO W/ CAD
6 series · 6 of 18 positions shown · non-contrast
Comparison: 12/23/2020 and earlier

CLINICAL DATA: Palpable abnormality in the LEFT breast, first noted
2 weeks ago. Initially, mass was painful. Mass is smaller and less
painful today.

EXAM:
DIGITAL DIAGNOSTIC UNILATERAL LEFT MAMMOGRAM WITH TOMOSYNTHESIS AND
CAD; ULTRASOUND LEFT BREAST LIMITED
TECHNIQUE: Left digital diagnostic mammography and breast tomosynthesis was
performed. The images were evaluated with computer-aided detection.;
Targeted ultrasound examination of the left breast was performed.

[L CC synth-2D (1 of 2)]
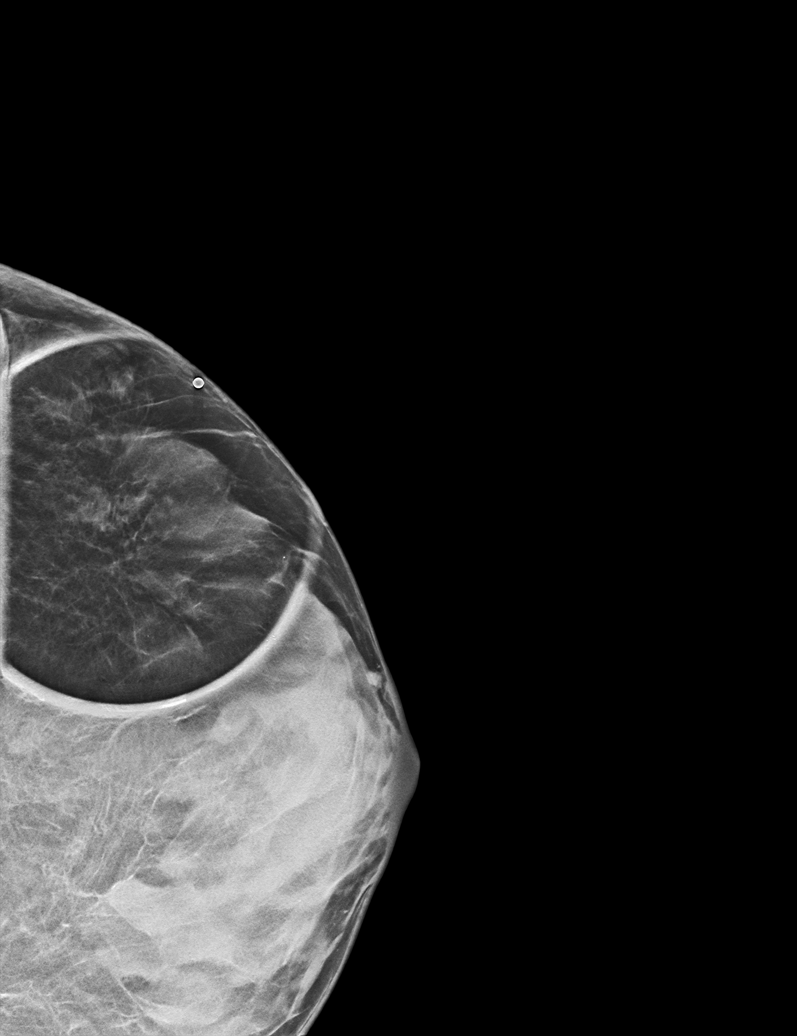

[L CC synth-2D (2 of 2)]
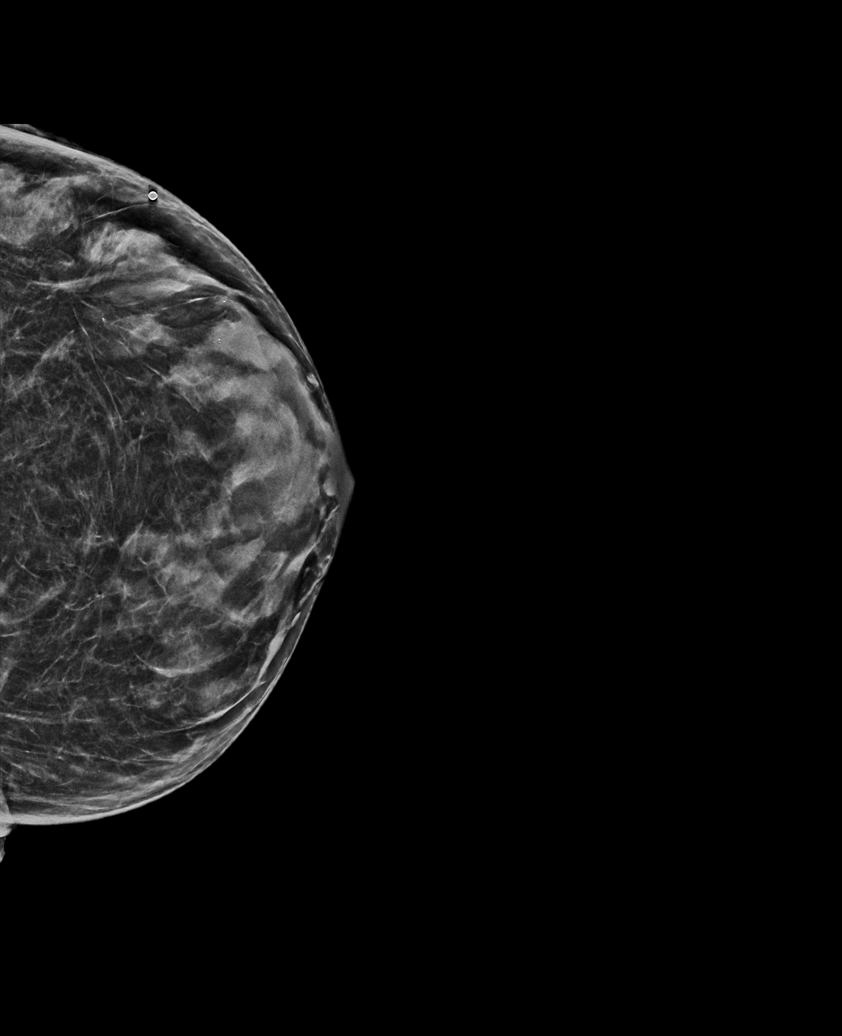

[L MLO synth-2D]
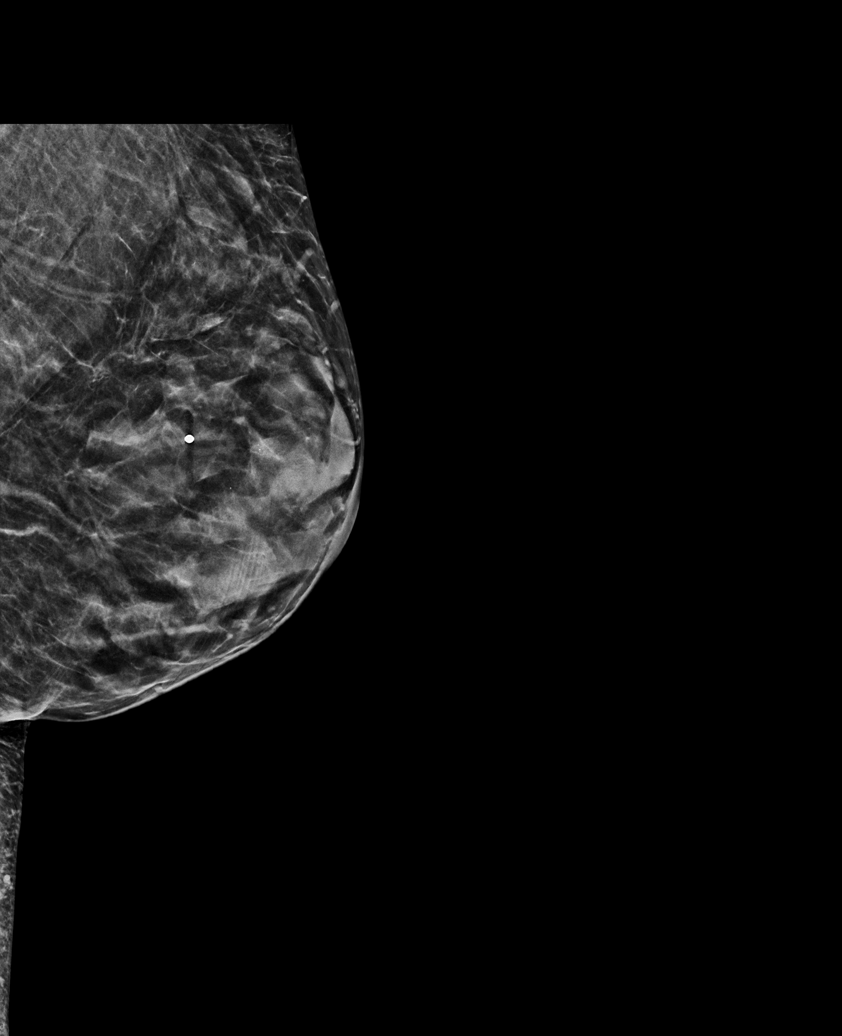

[L MLO tomo · tomo slice 27/53.0]
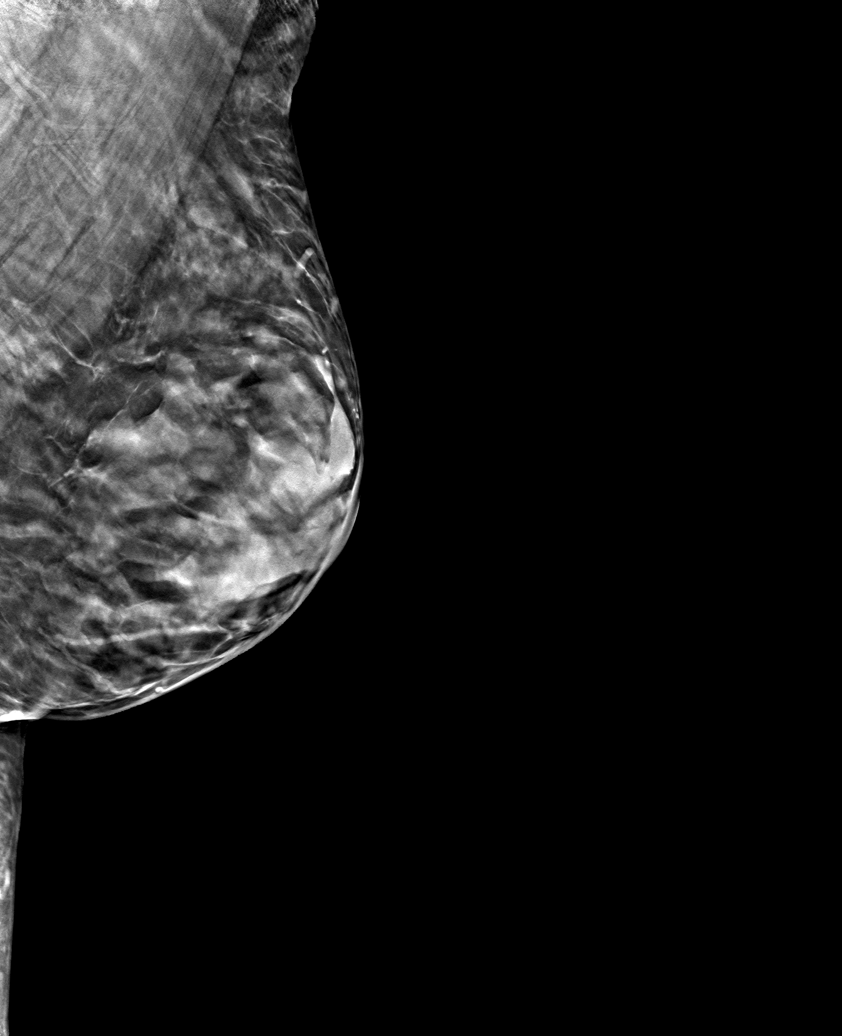

[L CC tomo (1 of 2) · tomo slice 17/32.0]
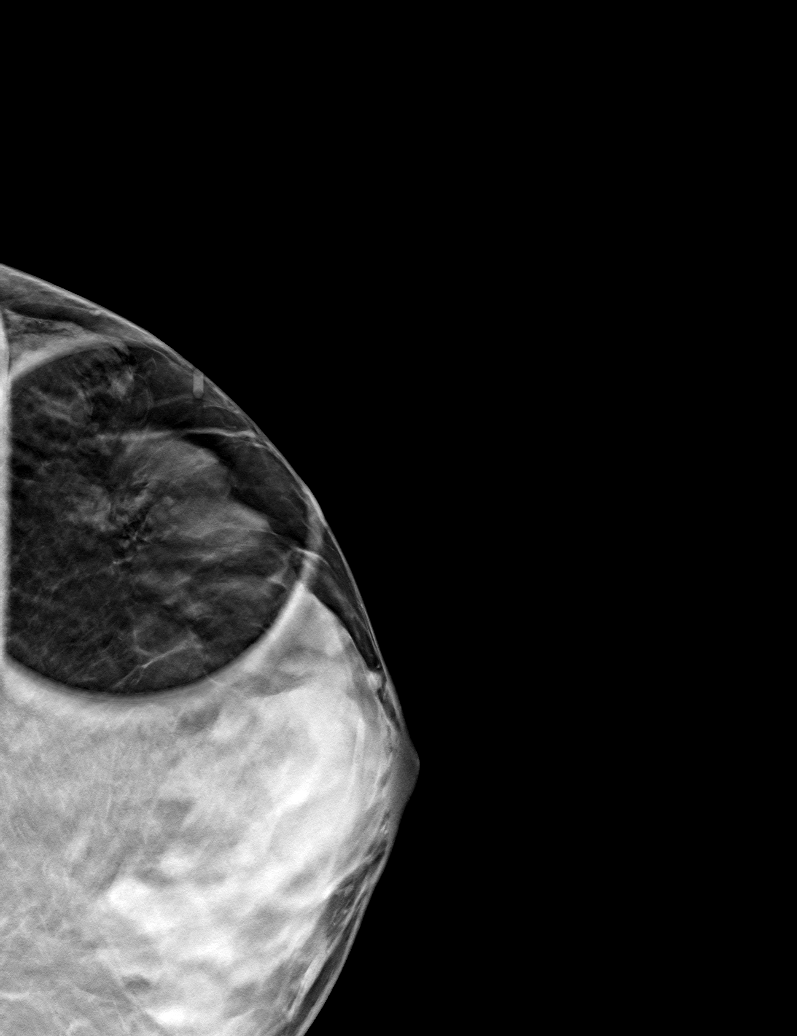

[L CC tomo (2 of 2) · tomo slice 25/50.0]
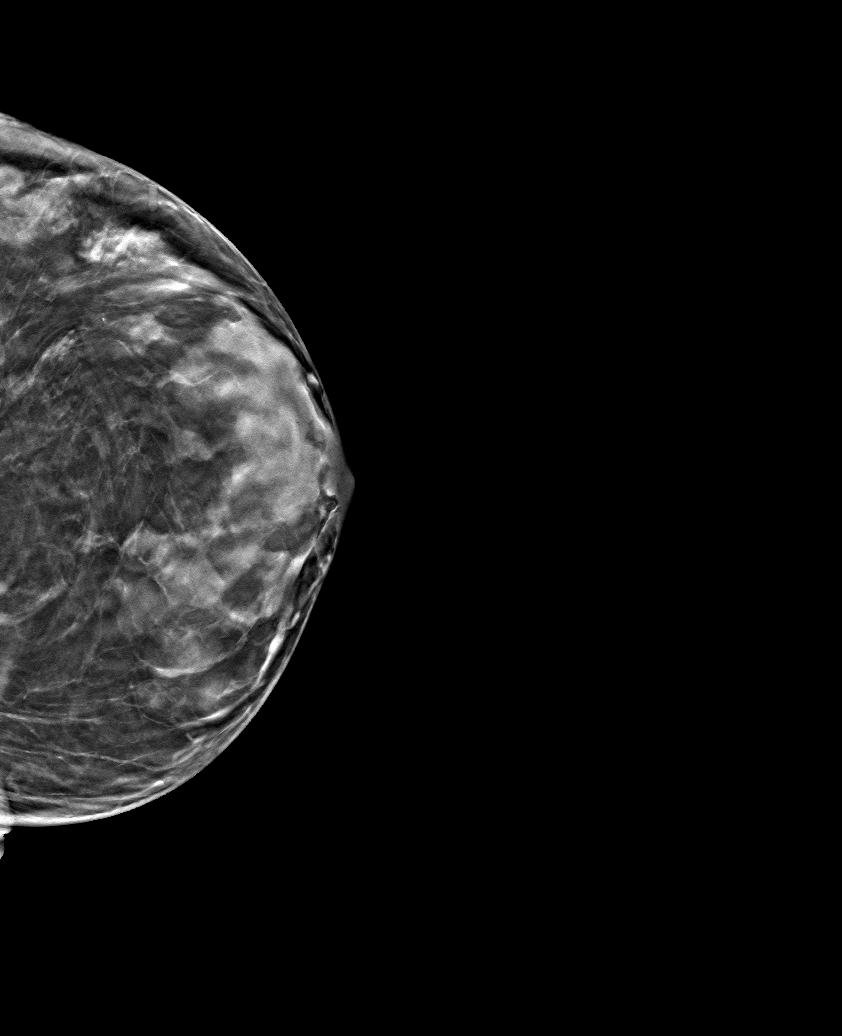

[6 of 18 positions shown; findings below may reference images not displayed]

ACR Breast Density Category c: The breast tissue is heterogeneously
dense, which may obscure small masses.
FINDINGS: Postoperative changes consistent with previous breast reduction.
Spot tangential view is performed in the area concern shows dense
fibroglandular tissue without mass.

On physical exam, I palpate focal areas of thickening in the
UPPER-OUTER QUADRANT of the LEFT breast corresponding to the area of
patient's concern.

Targeted ultrasound is performed, showing normal appearing,
extremely dense fibroglandular tissue in the LATERAL aspect of the
LEFT breast. Incidental note is made of a hypoechoic oval mass in
the 2:30 o'clock location of the LEFT breast 6 centimeters from the
nipple which measures 2.0 x 0.5 x 1.5 centimeters. This lesion is
not palpable on directed physical exam.
IMPRESSION: Palpable abnormality in the LATERAL aspect of the LEFT breast is
benign, dense fibroglandular tissue.

Incidental finding of probable fibroadenoma in the 2:30 o'clock
location of the LEFT breast 6 centimeters from the nipple. We
discussed management options including excision, biopsy, and close
follow-up. Imaging followup is recommended at 6, 12, and 24 months
to assess stability. The patient concurs with this plan.

RECOMMENDATION:
Recommend LEFT breast ultrasound in 6 months.

I have discussed the findings and recommendations with the patient.
If applicable, a reminder letter will be sent to the patient
regarding the next appointment.

BI-RADS CATEGORY  3: Probably benign.

## 2023-12-24 IMAGING — US US BREAST*L* LIMITED INC AXILLA
1 series · 7 of 7 positions shown · non-contrast
Comparison: 12/23/2020 and earlier

CLINICAL DATA: Palpable abnormality in the LEFT breast, first noted
2 weeks ago. Initially, mass was painful. Mass is smaller and less
painful today.

EXAM:
DIGITAL DIAGNOSTIC UNILATERAL LEFT MAMMOGRAM WITH TOMOSYNTHESIS AND
CAD; ULTRASOUND LEFT BREAST LIMITED
TECHNIQUE: Left digital diagnostic mammography and breast tomosynthesis was
performed. The images were evaluated with computer-aided detection.;
Targeted ultrasound examination of the left breast was performed.

[Series 1: us breast*left* limited inc axilla · 0.06mm/px · 7 of 7 slices shown]
[im 1/7]
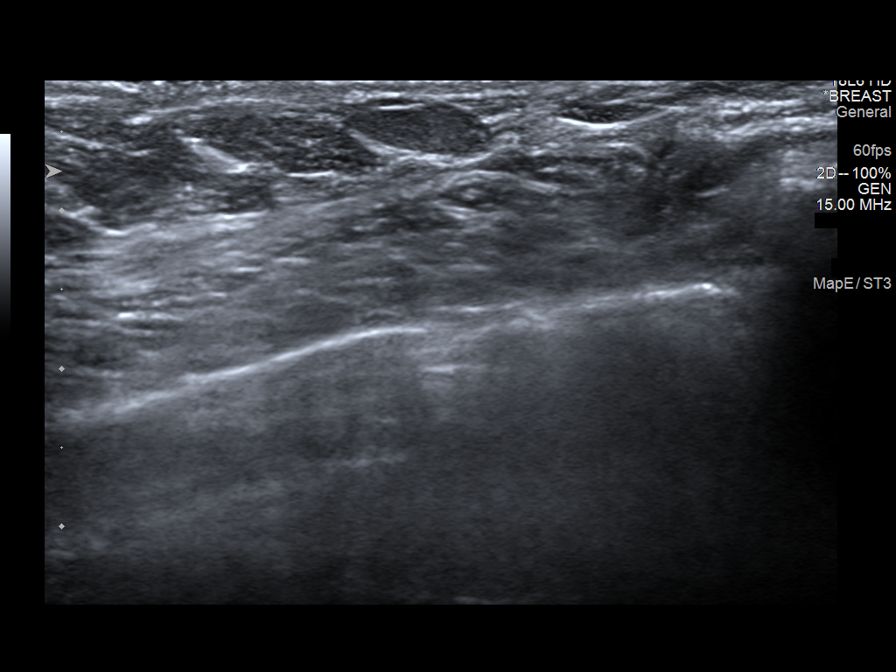
[im 2/7]
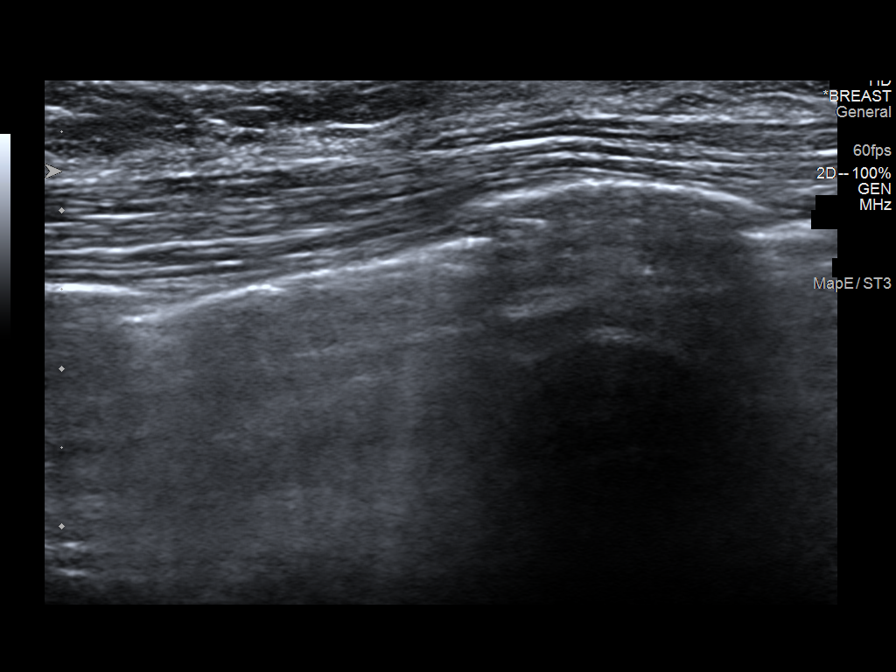
[im 3/7]
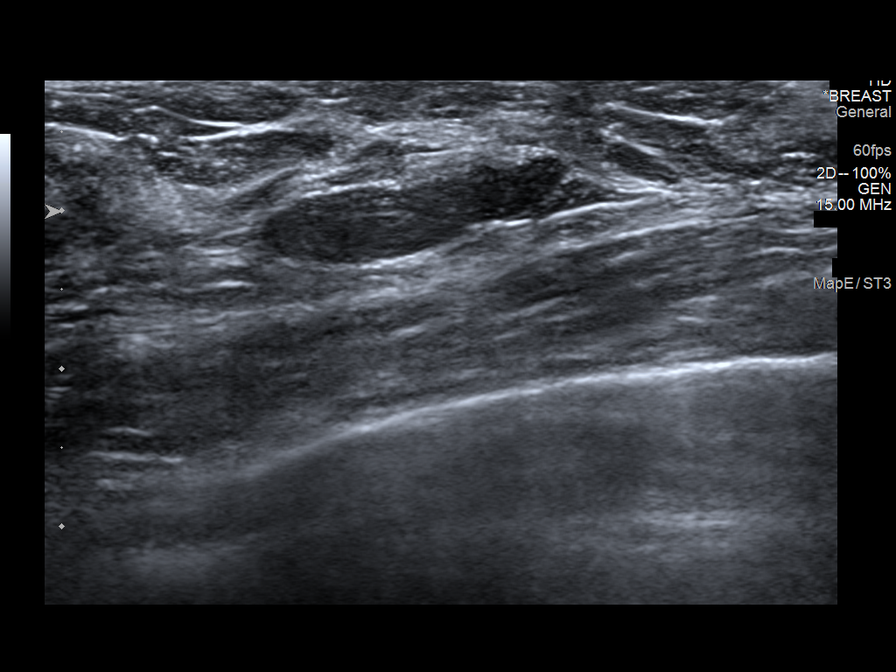
[im 4/7]
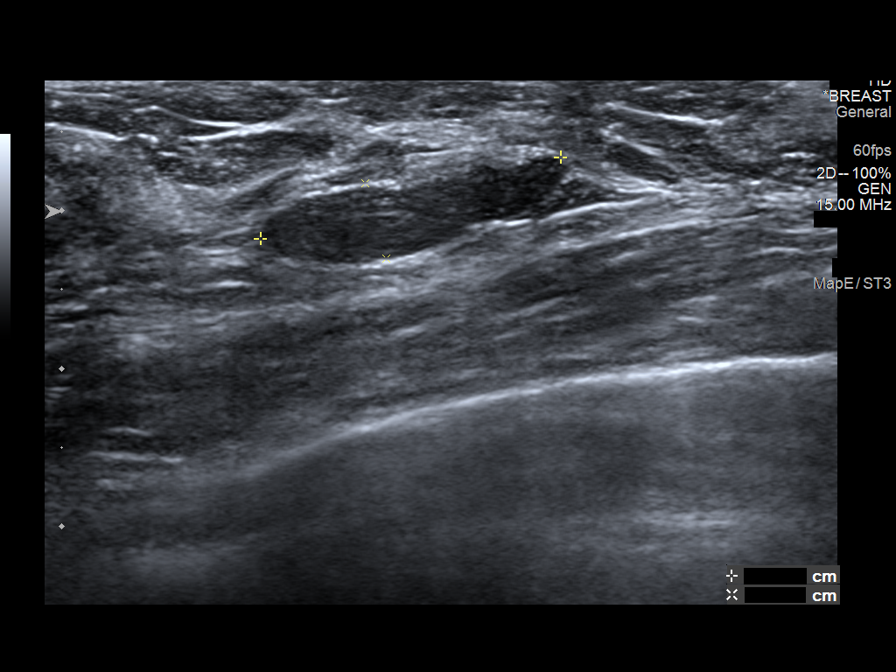
[im 5/7]
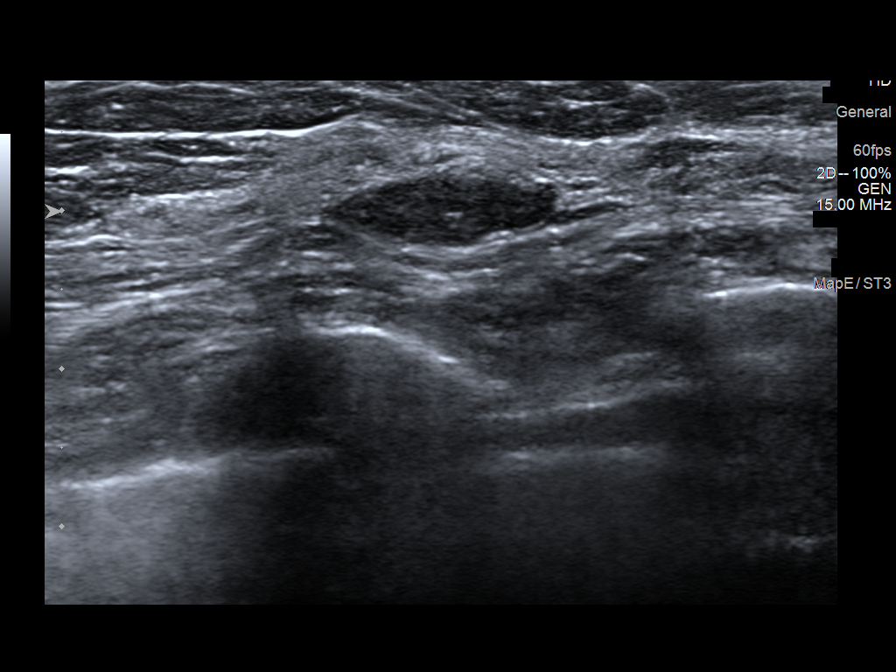
[im 6/7]
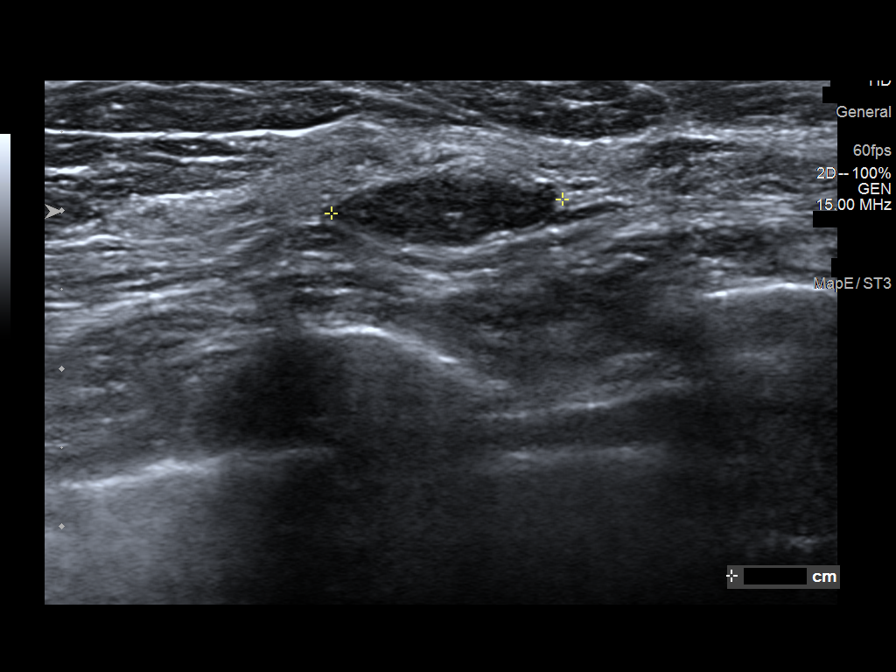
[im 7/7]
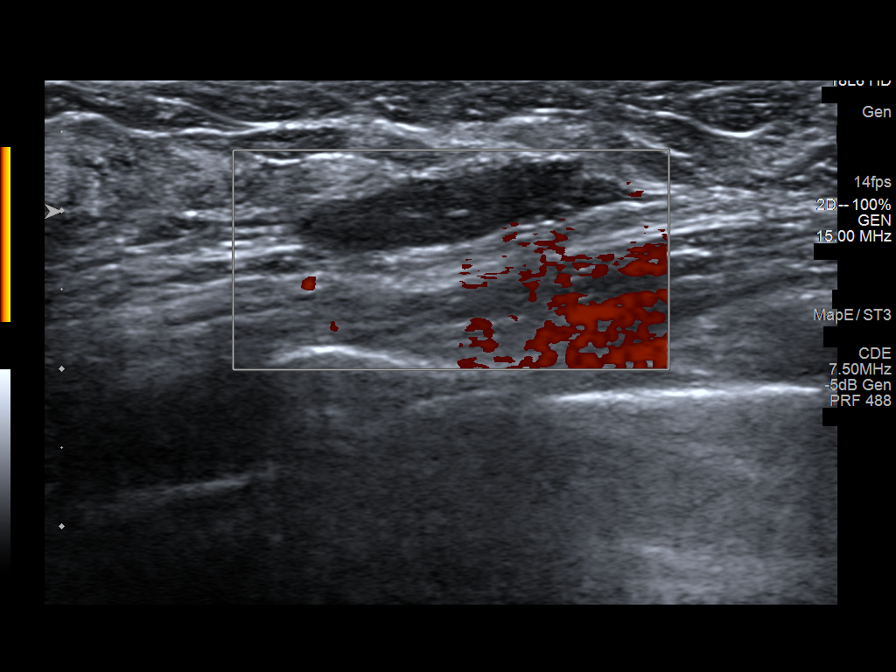

[7 of 7 positions shown; findings below may reference images not displayed]

ACR Breast Density Category c: The breast tissue is heterogeneously
dense, which may obscure small masses.
FINDINGS: Postoperative changes consistent with previous breast reduction.
Spot tangential view is performed in the area concern shows dense
fibroglandular tissue without mass.

On physical exam, I palpate focal areas of thickening in the
UPPER-OUTER QUADRANT of the LEFT breast corresponding to the area of
patient's concern.

Targeted ultrasound is performed, showing normal appearing,
extremely dense fibroglandular tissue in the LATERAL aspect of the
LEFT breast. Incidental note is made of a hypoechoic oval mass in
the 2:30 o'clock location of the LEFT breast 6 centimeters from the
nipple which measures 2.0 x 0.5 x 1.5 centimeters. This lesion is
not palpable on directed physical exam.
IMPRESSION: Palpable abnormality in the LATERAL aspect of the LEFT breast is
benign, dense fibroglandular tissue.

Incidental finding of probable fibroadenoma in the 2:30 o'clock
location of the LEFT breast 6 centimeters from the nipple. We
discussed management options including excision, biopsy, and close
follow-up. Imaging followup is recommended at 6, 12, and 24 months
to assess stability. The patient concurs with this plan.

RECOMMENDATION:
Recommend LEFT breast ultrasound in 6 months.

I have discussed the findings and recommendations with the patient.
If applicable, a reminder letter will be sent to the patient
regarding the next appointment.

BI-RADS CATEGORY  3: Probably benign.
